# Patient Record
Sex: Male | Born: 1995 | Race: Asian | Hispanic: Yes | Marital: Single | Smoking: Never smoker
Health system: Southern US, Community
[De-identification: ages and names within clinical notes are randomized; demographics above are authoritative.]

---

## 2016-07-05 ENCOUNTER — Emergency Department (HOSPITAL_COMMUNITY)
Admission: EM | Admit: 2016-07-05 | Discharge: 2016-07-06 | Disposition: A | Discharge status: Home / Self Care | Payer: Medicaid Other | Attending: Emergency Medicine | Admitting: Emergency Medicine

## 2016-07-05 ENCOUNTER — Encounter (HOSPITAL_COMMUNITY): Payer: Self-pay

## 2016-07-05 DIAGNOSIS — J111 Influenza due to unidentified influenza virus with other respiratory manifestations: Secondary | ICD-10-CM | POA: Insufficient documentation

## 2016-07-05 DIAGNOSIS — R05 Cough: Secondary | ICD-10-CM | POA: Diagnosis present

## 2016-07-05 DIAGNOSIS — R69 Illness, unspecified: Secondary | ICD-10-CM

## 2016-07-05 MED ORDER — SODIUM CHLORIDE 0.9 % IV BOLUS (SEPSIS)
1000.0000 mL | Freq: Once | INTRAVENOUS | Status: AC
  Administered 2016-07-05: 1000 mL via INTRAVENOUS

## 2016-07-05 MED ORDER — IBUPROFEN 800 MG PO TABS
800.0000 mg | ORAL_TABLET | Freq: Once | ORAL | Status: AC
  Administered 2016-07-05: 800 mg via ORAL
  Filled 2016-07-05: qty 1

## 2016-07-05 MED ORDER — ACETAMINOPHEN 500 MG PO TABS
ORAL_TABLET | ORAL | Status: AC
  Filled 2016-07-05: qty 2

## 2016-07-05 MED ORDER — ACETAMINOPHEN 500 MG PO TABS
1000.0000 mg | ORAL_TABLET | Freq: Once | ORAL | Status: AC
  Administered 2016-07-05: 1000 mg via ORAL

## 2016-07-05 NOTE — ED Triage Notes (Signed)
Pt complaining of cough and sore throat. Pt also complaining of generalized body aches. Pt also complaining of fevers and chills.

## 2016-07-05 NOTE — ED Provider Notes (Signed)
[facility]-EMERGENCY DEPT Provider Note   CSN: [id number] Arrival date & time: 07/05/16  2028  By signing my name below, I, Elder NegusRussell Johnston, attest that this documentation has been prepared under the direction and in the presence of Gilda Creasehristopher J Stephnie Parlier, MD. Electronically Signed:Russell Letitia LibraJohnston, Scribe. 07/05/16. 11:29 PM.    History   Chief Complaint Chief Complaint  Patient presents with  . Cough  . Sore Throat  . Generalized Body Aches    HPI Shawn Bernard is a 21 y.o. male who presents to the ED with flu-like symptoms since yesterday. The patient states that yesterday he developed a sore throat and this morning woke with lightheadedess, generalized weakness, cough with pleuritic chest pain, and chills. However denies any documented fevers. Denies any nausea or vomiting. He does state that his roommate had a sore throat earlier this week. He denies any medical history.    The history is provided by the patient. No language interpreter was used.    No past medical history on file.  There are no active problems to display for this patient.   No past surgical history on file.     Home Medications    Prior to Admission medications   Medication Sig Start Date End Date Taking? Authorizing Provider  DM-Phenylephrine-Acetaminophen (VICKS DAYQUIL COLD & FLU) 10-5-325 MG/15ML LIQD Take 15 mLs by mouth as needed (for cough).   Yes Historical Provider, MD  Phenyleph-Doxylamine-DM-APAP (NYQUIL SEVERE COLD/FLU) 5-6.25-10-325 MG/15ML LIQD Take 15 mLs by mouth as needed (for cough).   Yes Historical Provider, MD  benzonatate (TESSALON) 100 MG capsule Take 1 capsule (100 mg total) by mouth every 8 (eight) hours. 07/06/16   Gilda Creasehristopher J Elise Knobloch, MD  ibuprofen (ADVIL,MOTRIN) 800 MG tablet Take 1 tablet (800 mg total) by mouth 3 (three) times daily. 07/06/16   Gilda Creasehristopher J Kandie Keiper, MD    Family History No family history on file.  Social History Social History  Substance Use Topics  .  Smoking status: Never Smoker  . Smokeless tobacco: Never Used  . Alcohol use No     Allergies   Patient has no known allergies.   Review of Systems Review of Systems  Constitutional: Positive for chills. Negative for fever.       Generalized weakness  HENT: Positive for sore throat.   Respiratory: Positive for cough.        Pleuritic chest pain  All other systems reviewed and are negative.        Physical Exam Updated Vital Signs BP 112/59 (BP Location: Left Arm)   Pulse 68   Temp 99 F (37.2 C) (Oral)   Resp 18   Ht 5\' 11"  (1.803 m)   Wt 170 lb (77.1 kg)   SpO2 98%   BMI 23.71 kg/m   Physical Exam  Constitutional: He is oriented to person, place, and time. He appears well-developed and well-nourished. No distress.  HENT:  Head: Normocephalic and atraumatic.  Right Ear: Hearing normal.  Left Ear: Hearing normal.  Nose: Nose normal.  Mouth/Throat: Oropharynx is clear and moist and mucous membranes are normal.  Eyes: Conjunctivae and EOM are normal. Pupils are equal, round, and reactive to light.  Neck: Normal range of motion. Neck supple.  Cardiovascular: Regular rhythm, S1 normal and S2 normal.  Exam reveals no gallop and no friction rub.   No murmur heard. Tachycardic.   Pulmonary/Chest: Effort normal. No respiratory distress. He has rhonchi in the right lower field and the left lower field. He exhibits no  tenderness.  Abdominal: Soft. Normal appearance and bowel sounds are normal. There is no hepatosplenomegaly. There is no tenderness. There is no rebound, no guarding, no tenderness at McBurney's point and negative Murphy's sign. No hernia.  Musculoskeletal: Normal range of motion.  Neurological: He is alert and oriented to person, place, and time. He has normal strength. No cranial nerve deficit or sensory deficit. Coordination normal. GCS eye subscore is 4. GCS verbal subscore is 5. GCS motor subscore is 6.  Skin: Skin is warm, dry and intact. No rash noted.  No cyanosis.  Psychiatric: He has a normal mood and affect. His speech is normal and behavior is normal. Thought content normal.  Nursing note and vitals reviewed.    ED Treatments / Results  DIAGNOSTIC STUDIES: Oxygen Saturation is 99 percent on room air which is normal by my interpretation.    COORDINATION OF CARE: 11:34 PM Discussed treatment plan with pt at bedside and pt agreed to plan.    Labs (all labs ordered are listed, but only abnormal results are displayed) Labs Reviewed  CBC WITH DIFFERENTIAL/PLATELET - Abnormal; Notable for the following:       Result Value   HCT 38.2 (*)    Lymphs Abs 0.6 (*)    All other components within normal limits  BASIC METABOLIC PANEL - Abnormal; Notable for the following:    Potassium 3.3 (*)    Glucose, Bld 110 (*)    All other components within normal limits  RAPID STREP SCREEN (NOT AT [facility])  CULTURE, GROUP A STREP [facility])    EKG  EKG Interpretation None       Radiology Dg Chest 2 View  Result Date: 07/06/2016 CLINICAL DATA:  Cough, congestion and fever x2 days EXAM: CHEST  2 VIEW COMPARISON:  None. FINDINGS: The heart size and mediastinal contours are within normal limits. No pulmonary consolidations. No effusion or pneumothorax. Mild increase in interstitial prominence may reflect bronchitic change. The visualized skeletal structures are unremarkable. IMPRESSION: Slight increase in interstitial prominence bilaterally which may reflect bronchitic change. No pulmonary consolidations. Electronically Signed   By: Tollie Eth M.D.   On: 07/06/2016 00:04    Procedures Procedures (including critical care time)  Medications Ordered in ED Medications  acetaminophen (TYLENOL) tablet 1,000 mg (1,000 mg Oral Given 07/05/16 2103)  sodium chloride 0.9 % bolus 1,000 mL (1,000 mLs Intravenous New Bag/Given 07/05/16 2348)  ibuprofen (ADVIL,MOTRIN) tablet 800 mg (800 mg Oral Given 07/05/16 2343)     Initial Impression / Assessment and Plan  / ED Course  I have reviewed the triage vital signs and the nursing notes.  Pertinent labs & imaging results that were available during my care of the patient were reviewed by me and considered in my medical decision making (see chart for details).  Clinical Course    Patient presents with upper respiratory infection symptoms. Patient does have positive sick contacts at home. He has been experiencing cough and sore throat with generalized body aches. He had any syncope today. Patient appears well. He was febrile at arrival, improved with treatment here in the ER. Blood work normal. Strep negative. Chest x-ray negative. Patient reassured, likely has influenza. Symptomatic care provided.  Final Clinical Impressions(s) / ED Diagnoses   Final diagnoses:  Influenza-like illness    New Prescriptions New Prescriptions   BENZONATATE (TESSALON) 100 MG CAPSULE    Take 1 capsule (100 mg total) by mouth every 8 (eight) hours.   IBUPROFEN (ADVIL,MOTRIN) 800 MG TABLET  Take 1 tablet (800 mg total) by mouth 3 (three) times daily.  I personally performed the services described in this documentation, which was scribed in my presence. The recorded information has been reviewed and is accurate.     Gilda Crease, MD 07/06/16 [phone]

## 2016-07-05 NOTE — ED Notes (Signed)
Pt reports he woke up feeling weak, body aches, and a sore throat. Pt states his roommate had a sore throat but none of the other symptoms.

## 2016-07-06 ENCOUNTER — Emergency Department (HOSPITAL_COMMUNITY): Payer: Medicaid Other

## 2016-07-06 LAB — BASIC METABOLIC PANEL
Anion gap: 9 (ref 5–15)
BUN: 7 mg/dL (ref 6–20)
CHLORIDE: 101 mmol/L (ref 101–111)
CO2: 25 mmol/L (ref 22–32)
CREATININE: 0.86 mg/dL (ref 0.61–1.24)
Calcium: 8.9 mg/dL (ref 8.9–10.3)
Glucose, Bld: 110 mg/dL — ABNORMAL HIGH (ref 65–99)
Potassium: 3.3 mmol/L — ABNORMAL LOW (ref 3.5–5.1)
SODIUM: 135 mmol/L (ref 135–145)

## 2016-07-06 LAB — CBC WITH DIFFERENTIAL/PLATELET
BASOS PCT: 0 %
Basophils Absolute: 0 10*3/uL (ref 0.0–0.1)
EOS ABS: 0 10*3/uL (ref 0.0–0.7)
EOS PCT: 0 %
HCT: 38.2 % — ABNORMAL LOW (ref 39.0–52.0)
HEMOGLOBIN: 13 g/dL (ref 13.0–17.0)
LYMPHS ABS: 0.6 10*3/uL — AB (ref 0.7–4.0)
Lymphocytes Relative: 11 %
MCH: 29.7 pg (ref 26.0–34.0)
MCHC: 34 g/dL (ref 30.0–36.0)
MCV: 87.4 fL (ref 78.0–100.0)
MONOS PCT: 8 %
Monocytes Absolute: 0.5 10*3/uL (ref 0.1–1.0)
NEUTROS PCT: 81 %
Neutro Abs: 4.6 10*3/uL (ref 1.7–7.7)
PLATELETS: 250 10*3/uL (ref 150–400)
RBC: 4.37 MIL/uL (ref 4.22–5.81)
RDW: 12.5 % (ref 11.5–15.5)
WBC: 5.7 10*3/uL (ref 4.0–10.5)

## 2016-07-06 LAB — RAPID STREP SCREEN (MED CTR MEBANE ONLY): STREPTOCOCCUS, GROUP A SCREEN (DIRECT): NEGATIVE

## 2016-07-06 MED ORDER — BENZONATATE 100 MG PO CAPS: 100.0000 mg | ORAL_CAPSULE | Freq: Three times a day (TID) | ORAL | 0 refills | Status: AC

## 2016-07-06 MED ORDER — IBUPROFEN 800 MG PO TABS: 800.0000 mg | ORAL_TABLET | Freq: Three times a day (TID) | ORAL | 0 refills | Status: AC

## 2016-07-06 NOTE — ED Notes (Signed)
Temp 99.0

## 2016-07-06 NOTE — ED Notes (Signed)
Repeat temp-100.6

## 2016-07-06 NOTE — ED Notes (Signed)
Pt verbalized understanding of d/c instructions and has no further questions. Pt is stable, A&Ox4, VSS.  

## 2016-07-08 LAB — CULTURE, GROUP A STREP (THRC)

## 2017-12-03 IMAGING — DX DG CHEST 2V
2 series · 2 of 2 positions shown · non-contrast
Comparison: None.

CLINICAL DATA: Cough, congestion and fever x2 days

EXAM:
CHEST  2 VIEW

[chest pa]
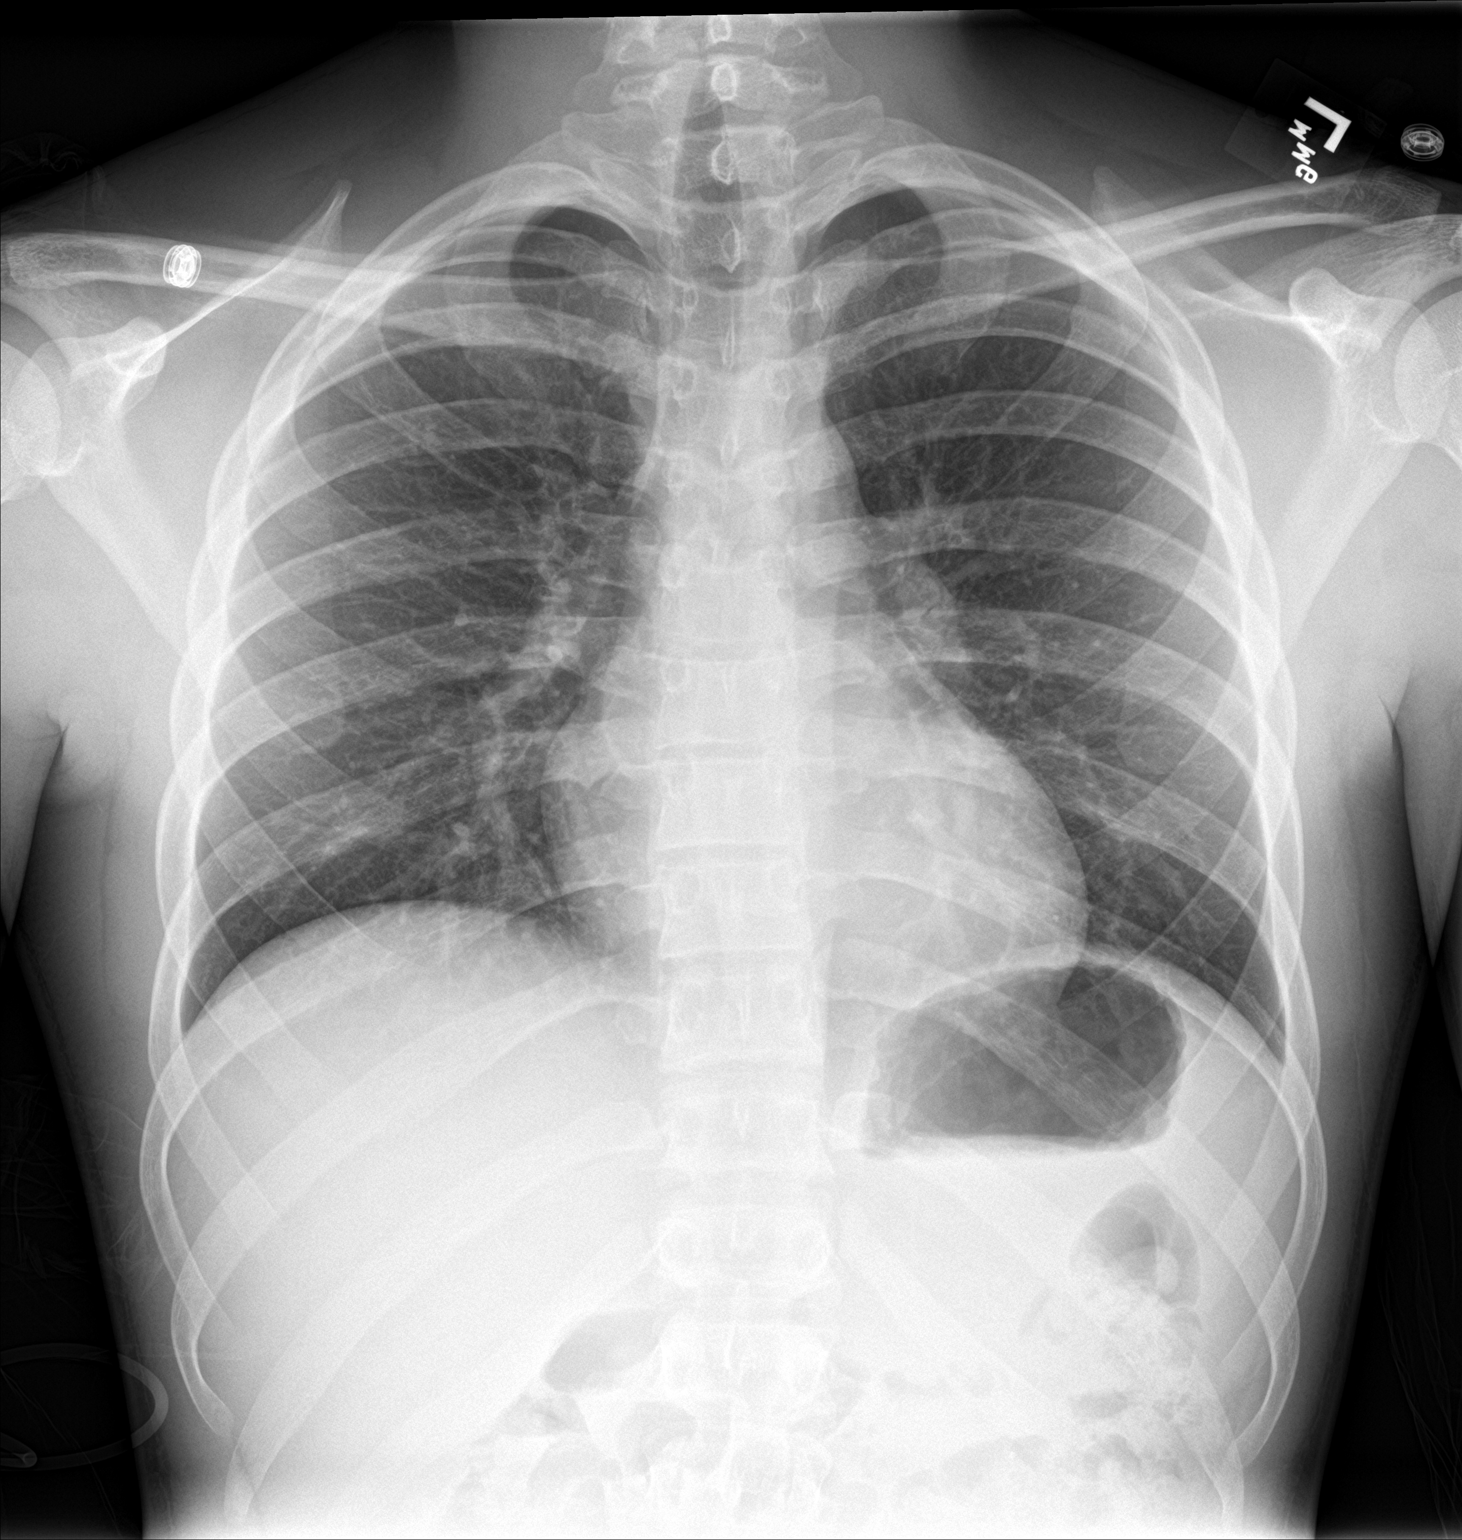

[chest lat]
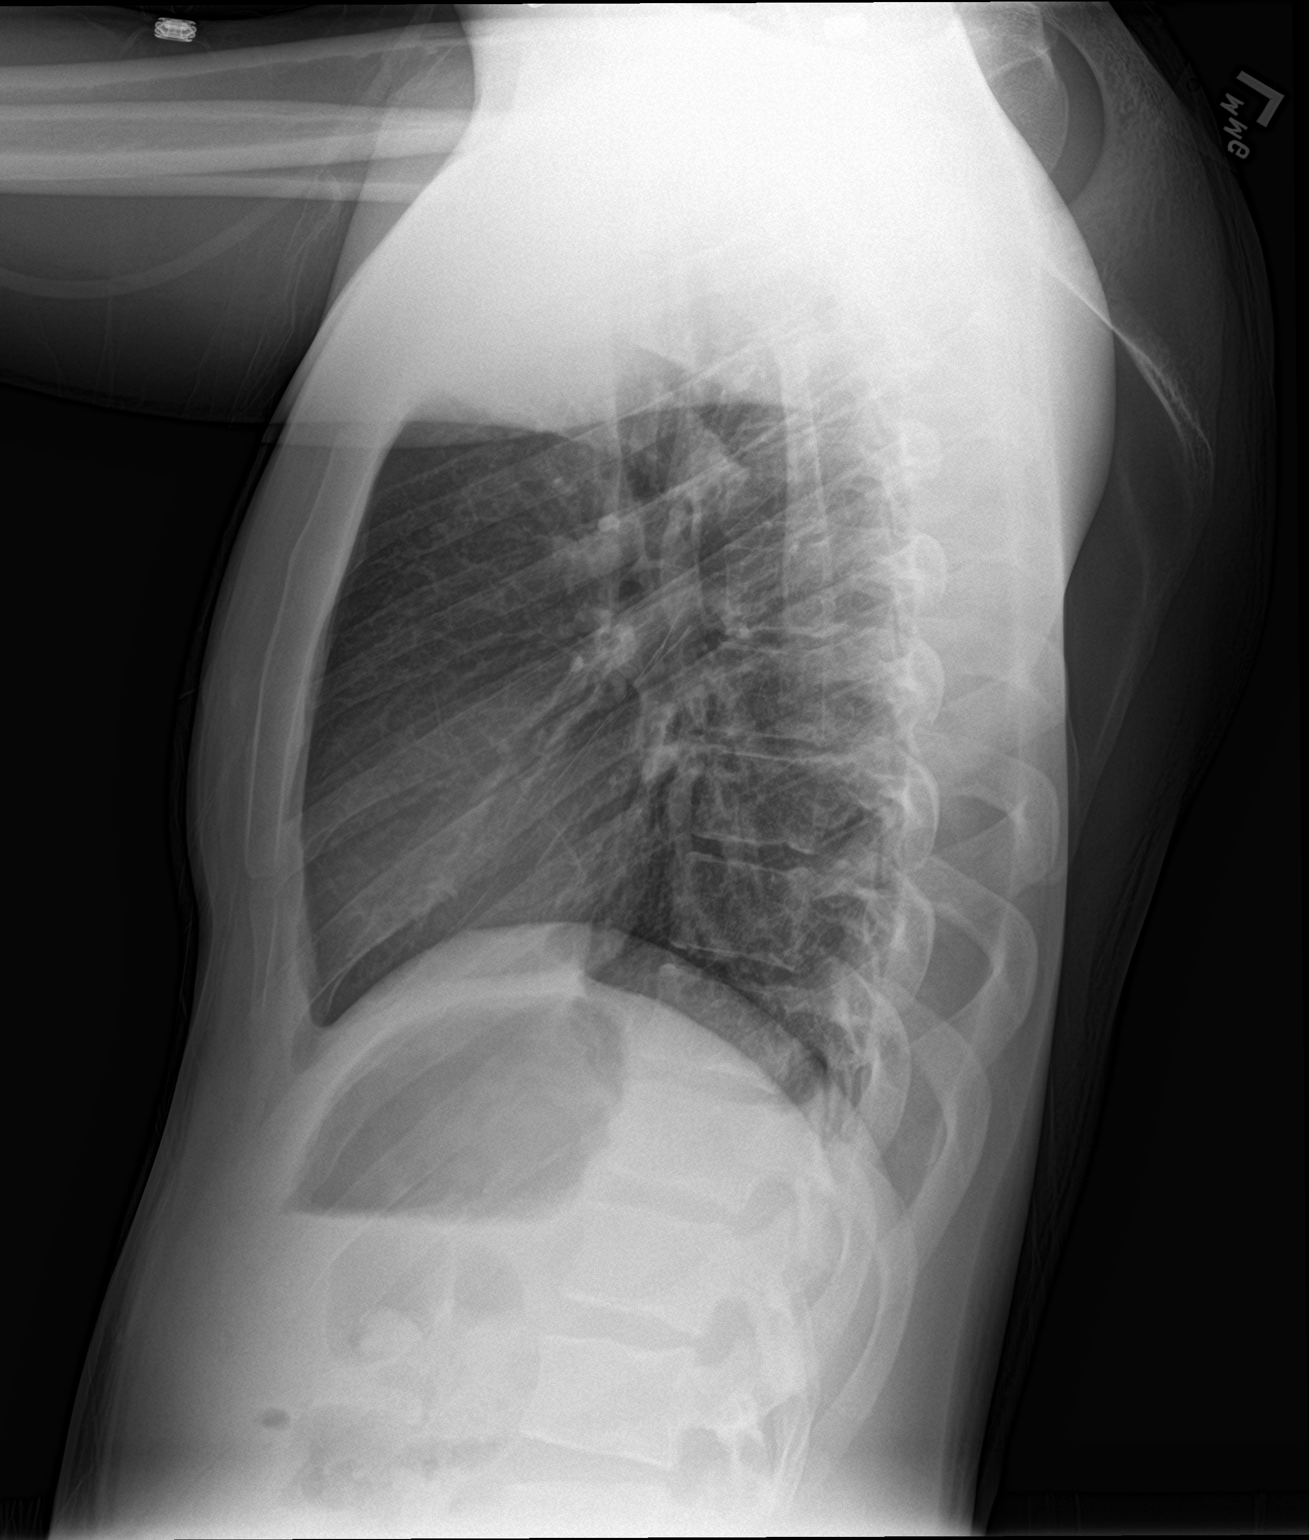

[2 of 2 positions shown; findings below may reference images not displayed]

FINDINGS: The heart size and mediastinal contours are within normal limits. No
pulmonary consolidations. No effusion or pneumothorax. Mild increase
in interstitial prominence may reflect bronchitic change. The
visualized skeletal structures are unremarkable.
IMPRESSION: Slight increase in interstitial prominence bilaterally which may
reflect bronchitic change. No pulmonary consolidations.
